# Patient Record
Sex: Female | Born: 1995
Health system: Southern US, Community
[De-identification: ages and names within clinical notes are randomized; demographics above are authoritative.]

## PROBLEM LIST (undated history)

## (undated) DIAGNOSIS — F329 Major depressive disorder, single episode, unspecified: Secondary | ICD-10-CM

## (undated) DIAGNOSIS — J189 Pneumonia, unspecified organism: Secondary | ICD-10-CM

## (undated) DIAGNOSIS — E739 Lactose intolerance, unspecified: Secondary | ICD-10-CM

## (undated) DIAGNOSIS — N920 Excessive and frequent menstruation with regular cycle: Secondary | ICD-10-CM

## (undated) DIAGNOSIS — N390 Urinary tract infection, site not specified: Principal | ICD-10-CM

## (undated) DIAGNOSIS — F32A Depression, unspecified: Secondary | ICD-10-CM

## (undated) DIAGNOSIS — K589 Irritable bowel syndrome without diarrhea: Secondary | ICD-10-CM

## (undated) DIAGNOSIS — E039 Hypothyroidism, unspecified: Secondary | ICD-10-CM

## (undated) DIAGNOSIS — B019 Varicella without complication: Secondary | ICD-10-CM

## (undated) DIAGNOSIS — R51 Headache: Secondary | ICD-10-CM

## (undated) DIAGNOSIS — T7840XA Allergy, unspecified, initial encounter: Secondary | ICD-10-CM

## (undated) DIAGNOSIS — J349 Unspecified disorder of nose and nasal sinuses: Secondary | ICD-10-CM

## (undated) DIAGNOSIS — H5210 Myopia, unspecified eye: Secondary | ICD-10-CM

## (undated) HISTORY — DX: Excessive and frequent menstruation with regular cycle: N92.0

## (undated) HISTORY — DX: Myopia, unspecified eye: H52.10

## (undated) HISTORY — DX: Varicella without complication: B01.9

## (undated) HISTORY — DX: Hypothyroidism, unspecified: E03.9

## (undated) HISTORY — DX: Lactose intolerance, unspecified: E73.9

## (undated) HISTORY — PX: TYMPANOSTOMY TUBE PLACEMENT: SHX32

## (undated) HISTORY — DX: Allergy, unspecified, initial encounter: T78.40XA

## (undated) HISTORY — PX: ADENOIDECTOMY: SUR15

## (undated) HISTORY — DX: Urinary tract infection, site not specified: N39.0

## (undated) HISTORY — DX: Pneumonia, unspecified organism: J18.9

---
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
## 2015-02-03 ENCOUNTER — Encounter: Payer: Self-pay | Admitting: Pediatrics

## 2015-02-03 ENCOUNTER — Ambulatory Visit (INDEPENDENT_AMBULATORY_CARE_PROVIDER_SITE_OTHER): Payer: BC Managed Care – PPO | Admitting: Pediatrics

## 2015-02-03 VITALS — Wt 116.7 lb

## 2015-02-03 DIAGNOSIS — L03031 Cellulitis of right toe: Secondary | ICD-10-CM | POA: Diagnosis not present

## 2015-02-03 MED ORDER — CEPHALEXIN 500 MG PO CAPS: 500.0000 mg | ORAL_CAPSULE | Freq: Three times a day (TID) | ORAL | Status: AC

## 2015-02-03 NOTE — Patient Instructions (Addendum)
Keflex, 1 capsul three times a day for 10 days  [facility] if no improvement after 5 days of antibiotic  Paronychia Paronychia is an inflammatory reaction involving the folds of the skin surrounding the fingernail. This is commonly caused by an infection in the skin around a nail. The most common cause of paronychia is frequent wetting of the hands (as seen with bartenders, food servers, nurses or others who wet their hands). This makes the skin around the fingernail susceptible to infection by bacteria (germs) or fungus. Other predisposing factors are:  Aggressive manicuring.  Nail biting.  Thumb sucking. The most common cause is a staphylococcal (a type of germ) infection, or a fungal (Candida) infection. When caused by a germ, it usually comes on suddenly with redness, swelling, pus and is often painful. It may get under the nail and form an abscess (collection of pus), or form an abscess around the nail. If the nail itself is infected with a fungus, the treatment is usually prolonged and may require oral medicine for up to one year. Your caregiver will determine the length of time treatment is required. The paronychia caused by bacteria (germs) may largely be avoided by not pulling on hangnails or picking at cuticles. When the infection occurs at the tips of the finger it is called felon. When the cause of paronychia is from the herpes simplex virus (HSV) it is called herpetic whitlow. TREATMENT  When an abscess is present treatment is often incision and drainage. This means that the abscess must be cut open so the pus can get out. When this is done, the following home care instructions should be followed. HOME CARE INSTRUCTIONS   It is important to keep the affected fingers very dry. Rubber or plastic gloves over cotton gloves should be used whenever the hand must be placed in water.  Keep wound clean, dry and dressed as suggested by your caregiver between warm soaks  or warm compresses.  Soak in warm water for fifteen to twenty minutes three to four times per day for bacterial infections. Fungal infections are very difficult to treat, so often require treatment for long periods of time.  For bacterial (germ) infections take antibiotics (medicine which kill germs) as directed and finish the prescription, even if the problem appears to be solved before the medicine is gone.  Only take over-the-counter or prescription medicines for pain, discomfort, or fever as directed by your caregiver. SEEK IMMEDIATE MEDICAL CARE IF:  You have redness, swelling, or increasing pain in the wound.  You notice pus coming from the wound.  You have a fever.  You notice a bad smell coming from the wound or dressing. Document Released: 04/19/2001 Document Revised: 01/16/2012 Document Reviewed: 12/19/2008 [facility] Patient Information 2015 [employer] [place]. This information is not intended to replace advice given to you by your health care provider. Make sure you discuss any questions you have with your health care provider.

## 2015-02-03 NOTE — Progress Notes (Signed)
Colleen Brown is a 19yo female who presents for evaluation of a possible infection located at right great toe. Symptoms include erythema located proximal to toenail and pain with palpation. Patient denies chills and fever greater than 100. Precipitating event:unknown. Treatment to date has included warm water soaks with no relief.  The following portions of the patient's history were reviewed and updated as appropriate: allergies, current medications, past family history, past medical history, past social history, past surgical history and problem list.  Review of Systems  Pertinent items are noted in HPI.  Objective:   General appearance: alert and cooperative  Extremities: normal except for right great toe with mild erythema and swelling  Skin: Skin color, texture, turgor normal. No rashes or lesions  Neurologic: Grossly normal  Assessment:   Paronychia, right great toe  Plan:   Keflex prescribed.  Pain medication: OTC.  Warm water and epsom salt soaks  Follow up as needed

## 2015-04-07 ENCOUNTER — Encounter: Payer: Self-pay | Admitting: Pediatrics

## 2015-04-07 ENCOUNTER — Ambulatory Visit (INDEPENDENT_AMBULATORY_CARE_PROVIDER_SITE_OTHER): Payer: BC Managed Care – PPO | Admitting: Pediatrics

## 2015-04-07 VITALS — Wt 114.8 lb

## 2015-04-07 DIAGNOSIS — N390 Urinary tract infection, site not specified: Secondary | ICD-10-CM

## 2015-04-07 DIAGNOSIS — R0781 Pleurodynia: Secondary | ICD-10-CM

## 2015-04-07 HISTORY — DX: Urinary tract infection, site not specified: N39.0

## 2015-04-07 LAB — POCT URINALYSIS DIPSTICK
BILIRUBIN UA: NEGATIVE
Glucose, UA: NEGATIVE
KETONES UA: NEGATIVE
Nitrite, UA: POSITIVE
Spec Grav, UA: 1.02
Urobilinogen, UA: NEGATIVE
pH, UA: 6

## 2015-04-07 LAB — POCT URINE PREGNANCY: Preg Test, Ur: NEGATIVE

## 2015-04-07 MED ORDER — CEPHALEXIN 500 MG PO CAPS: 500.0000 mg | ORAL_CAPSULE | Freq: Three times a day (TID) | ORAL | Status: AC

## 2015-04-07 NOTE — Progress Notes (Signed)
PCP:Georgiann Hahn, MD Chief Complaint  Patient presents with  . rib cage pain    Current Issues:  Presents with 7 days of pain under the left side rib cage Associated symptoms include:  flank pain on the left  There is no history of of similar symptoms. Sexually active:  Yes with female.   No concern for STI.  Prior to Admission medications   Medication Sig Start Date End Date Taking? Authorizing Provider  albuterol (PROAIR HFA) 108 (90 BASE) MCG/ACT inhaler Inhale 2 puffs into the lungs every 4 (four) hours as needed (for cough or chest tightness). Also, 15 min before intense phys activity. 01/06/14   Meryl Dare, NP  calcium carbonate (OS-CAL) 600 MG TABS Take 600 mg by mouth 2 (two) times daily with a meal.    Historical Provider, MD  cephALEXin (KEFLEX) 500 MG capsule Take 1 capsule (500 mg total) by mouth 3 (three) times daily. 04/07/15 04/15/15  Estelle June, NP  cetirizine (ZYRTEC) 10 MG tablet Take 10 mg by mouth daily.    Historical Provider, MD  GILDESS FE 1/20 1-20 MG-MCG tablet  07/15/13   Historical Provider, MD  glycopyrrolate (ROBINUL) 1 MG tablet Take 1 tablet (1 mg total) by mouth 2 (two) times daily. 01/22/14   Meryl Dare, MD  hyoscyamine (LEVSIN, ANASPAZ) 0.125 MG tablet take 1 tablet by mouth every 4 hours if needed for STOMACH CRAMPS AND DIARRHEA 02/03/14   Georgiann Hahn, MD  ibuprofen (ADVIL,MOTRIN) 600 MG tablet Take 1 tablet (600 mg total) by mouth every 6 (six) hours as needed. 06/10/14   April Palumbo, MD  levothyroxine (SYNTHROID) 75 MCG tablet Take 1 tablet (75 mcg total) by mouth daily before breakfast. 10/28/14 11/11/14  Preston Fleeting, MD  levothyroxine (SYNTHROID, LEVOTHROID) 75 MCG tablet take 1 tablet by mouth once daily 04/02/14 05/03/14  Georgiann Hahn, MD  methocarbamol (ROBAXIN) 500 MG tablet Take 1 tablet (500 mg total) by mouth 2 (two) times daily. 06/10/14   April Palumbo, MD  Multiple Vitamin (MULTIVITAMIN) tablet Take 1 tablet by mouth daily.     Historical Provider, MD  sertraline (ZOLOFT) 50 MG tablet Take 50 mg by mouth daily.    Historical Provider, MD  traMADol (ULTRAM) 50 MG tablet Take 1 tablet (50 mg total) by mouth every 6 (six) hours as needed. 06/10/14   April Palumbo, MD  traZODone (DESYREL) 100 MG tablet Take 100 mg by mouth at bedtime.    Historical Provider, MD    Review of Systems:pertinent data listed in HPI  PE:  Wt 114 lb 12.8 oz (52.073 kg) Constitutional- well nourished, well developed young adult, no distress Heart-regular rate and rhythm, no murmurs, clicks or rubs Lungs-bilateral clear to auscultation Back- left sided CVA Abdomen- upper left quadrant tender to palpation, soft, non-distended   Results for orders placed or performed in visit on 04/07/15  POCT Urinalysis Dipstick  Result Value Ref Range   Color, UA yellow    Clarity, UA cloudy    Glucose, UA neg    Bilirubin, UA neg    Ketones, UA neg    Spec Grav, UA 1.020    Blood, UA trace    pH, UA 6.0    Protein, UA trace    Urobilinogen, UA negative    Nitrite, UA pos    Leukocytes, UA Trace   POCT urine pregnancy  Result Value Ref Range   Preg Test, Ur Negative     Assessment  UTI Rib  pain on left side    and Plan:  - POCT Urinalysis Dipstick - POCT urine pregnancy - Urine culture, pending -started on keflex Follow up as needed

## 2015-04-07 NOTE — Patient Instructions (Addendum)
Drink plenty of water Keflex, three times a day for 7 days If no improvement in 4 days, call the office  Urinary Tract Infection Urinary tract infections (UTIs) can develop anywhere along your urinary tract. Your urinary tract is your body's drainage system for removing wastes and extra water. Your urinary tract includes two kidneys, two ureters, a bladder, and a urethra. Your kidneys are a pair of bean-shaped organs. Each kidney is about the size of your fist. They are located below your ribs, one on each side of your spine. CAUSES Infections are caused by microbes, which are microscopic organisms, including fungi, viruses, and bacteria. These organisms are so small that they can only be seen through a microscope. Bacteria are the microbes that most commonly cause UTIs. SYMPTOMS  Symptoms of UTIs may vary by age and gender of the patient and by the location of the infection. Symptoms in young women typically include a frequent and intense urge to urinate and a painful, burning feeling in the bladder or urethra during urination. Older women and men are more likely to be tired, shaky, and weak and have muscle aches and abdominal pain. A fever may mean the infection is in your kidneys. Other symptoms of a kidney infection include pain in your back or sides below the ribs, nausea, and vomiting. DIAGNOSIS To diagnose a UTI, your caregiver will ask you about your symptoms. Your caregiver also will ask to provide a urine sample. The urine sample will be tested for bacteria and white blood cells. White blood cells are made by your body to help fight infection. TREATMENT  Typically, UTIs can be treated with medication. Because most UTIs are caused by a bacterial infection, they usually can be treated with the use of antibiotics. The choice of antibiotic and length of treatment depend on your symptoms and the type of bacteria causing your infection. HOME CARE INSTRUCTIONS  If you were prescribed antibiotics,  take them exactly as your caregiver instructs you. Finish the medication even if you feel better after you have only taken some of the medication.  Drink enough water and fluids to keep your urine clear or pale yellow.  Avoid caffeine, tea, and carbonated beverages. They tend to irritate your bladder.  Empty your bladder often. Avoid holding urine for long periods of time.  Empty your bladder before and after sexual intercourse.  After a bowel movement, women should cleanse from front to back. Use each tissue only once. SEEK MEDICAL CARE IF:   You have back pain.  You develop a fever.  Your symptoms do not begin to resolve within 3 days. SEEK IMMEDIATE MEDICAL CARE IF:   You have severe back pain or lower abdominal pain.  You develop chills.  You have nausea or vomiting.  You have continued burning or discomfort with urination. MAKE SURE YOU:   Understand these instructions.  Will watch your condition.  Will get help right away if you are not doing well or get worse. Document Released: 08/03/2005 Document Revised: 04/24/2012 Document Reviewed: 12/02/2011 [facility] Patient Information 2015 [place], [place]. This information is not intended to replace advice given to you by your health care provider. Make sure you discuss any questions you have with your health care provider.

## 2015-04-09 LAB — URINE CULTURE
Colony Count: NO GROWTH
Organism ID, Bacteria: NO GROWTH

## 2015-04-13 ENCOUNTER — Encounter: Payer: Self-pay | Admitting: Pediatrics

## 2015-04-13 ENCOUNTER — Ambulatory Visit (INDEPENDENT_AMBULATORY_CARE_PROVIDER_SITE_OTHER): Payer: BC Managed Care – PPO | Admitting: Pediatrics

## 2015-04-13 ENCOUNTER — Ambulatory Visit
Admission: RE | Admit: 2015-04-13 | Discharge: 2015-04-13 | Disposition: A | Discharge status: Home / Self Care | Payer: BC Managed Care – PPO | Source: Ambulatory Visit | Attending: Pediatrics | Admitting: Pediatrics

## 2015-04-13 VITALS — Wt 115.9 lb

## 2015-04-13 DIAGNOSIS — R1012 Left upper quadrant pain: Secondary | ICD-10-CM

## 2015-04-13 DIAGNOSIS — R0781 Pleurodynia: Secondary | ICD-10-CM

## 2015-04-13 LAB — CBC WITH DIFFERENTIAL/PLATELET
BASOS ABS: 0.1 10*3/uL (ref 0.0–0.1)
Basophils Relative: 1 % (ref 0–1)
EOS ABS: 0.5 10*3/uL (ref 0.0–0.7)
EOS PCT: 6 % — AB (ref 0–5)
HCT: 40.7 % (ref 36.0–46.0)
Hemoglobin: 14.1 g/dL (ref 12.0–15.0)
Lymphocytes Relative: 30 % (ref 12–46)
Lymphs Abs: 2.5 10*3/uL (ref 0.7–4.0)
MCH: 30.5 pg (ref 26.0–34.0)
MCHC: 34.6 g/dL (ref 30.0–36.0)
MCV: 87.9 fL (ref 78.0–100.0)
MPV: 9.7 fL (ref 8.6–12.4)
Monocytes Absolute: 0.6 10*3/uL (ref 0.1–1.0)
Monocytes Relative: 7 % (ref 3–12)
NEUTROS PCT: 56 % (ref 43–77)
Neutro Abs: 4.6 10*3/uL (ref 1.7–7.7)
Platelets: 284 10*3/uL (ref 150–400)
RBC: 4.63 MIL/uL (ref 3.87–5.11)
RDW: 12.4 % (ref 11.5–15.5)
WBC: 8.2 10*3/uL (ref 4.0–10.5)

## 2015-04-13 LAB — COMPREHENSIVE METABOLIC PANEL
ALK PHOS: 45 U/L (ref 39–117)
ALT: 13 U/L (ref 0–35)
AST: 16 U/L (ref 0–37)
Albumin: 4.4 g/dL (ref 3.5–5.2)
BILIRUBIN TOTAL: 0.7 mg/dL (ref 0.2–1.1)
BUN: 8 mg/dL (ref 6–23)
CO2: 28 mEq/L (ref 19–32)
Calcium: 10 mg/dL (ref 8.4–10.5)
Chloride: 97 mEq/L (ref 96–112)
Creat: 0.81 mg/dL (ref 0.50–1.10)
GLUCOSE: 88 mg/dL (ref 70–99)
Potassium: 4.2 mEq/L (ref 3.5–5.3)
SODIUM: 134 meq/L — AB (ref 135–145)
TOTAL PROTEIN: 7.5 g/dL (ref 6.0–8.3)

## 2015-04-13 LAB — THYROID PANEL WITH TSH
Free Thyroxine Index: 2.4 (ref 1.4–3.8)
T3 UPTAKE: 24 % (ref 22–35)
T4, Total: 9.9 ug/dL (ref 4.5–12.0)
TSH: 4.976 u[IU]/mL — ABNORMAL HIGH (ref 0.350–4.500)

## 2015-04-13 MED ORDER — LEVOTHYROXINE SODIUM 75 MCG PO TABS: 75.0000 ug | ORAL_TABLET | Freq: Every day | ORAL | Status: DC

## 2015-04-13 NOTE — Patient Instructions (Signed)
Abdominal xray- [facility]- [address] Lab work- will call with results

## 2015-04-13 NOTE — Progress Notes (Signed)
Maralyn SagoSarah is a 19yo female who was seen on 04/07/2015 for left lower rib pain. She was diagnosed with a UTI and started on abx. She returns today with continued left rib pain. Denies fever, vomiting, diarrhea.    Review of Systems  Constitutional:  Negative for  appetite change.  HENT:  Negative for nasal and ear discharge.   Eyes: Negative for discharge, redness and itching.  Respiratory:  Negative for cough and wheezing.   Cardiovascular: Negative.  Gastrointestinal: Negative for vomiting and diarrhea.  Musculoskeletal: Negative for arthralgias. Positive for pain in the left rib area.  Skin: Negative for rash.  Neurological: Negative      Objective:   Physical Exam  Constitutional: Appears well-developed and well-nourished.   Pulmonary/Chest: Effort normal and breath sounds normal. No wheezes with  no retractions.  Abdominal: Soft. Bowel sounds are normal. No distension and no tenderness.  Neurological: Active and alert.  Skin: Skin is warm and moist. No rash noted.      Assessment:      Left upper abdominal pain  Plan:   Labs: CBC with diff, CMP, Thyroid panel  Abdominal xray to rule out constipation and other pathologic processes  Follow as needed

## 2015-04-14 ENCOUNTER — Telehealth: Payer: Self-pay | Admitting: Pediatrics

## 2015-04-14 MED ORDER — LEVOTHYROXINE SODIUM 88 MCG PO TABS: 88.0000 ug | ORAL_TABLET | Freq: Every day | ORAL | Status: DC

## 2015-04-14 NOTE — Telephone Encounter (Signed)
Colleen Brown called, wanted to find out if there was a drug interaction between the hyoscyamine and Miralax. Assured her there was not a drug interaction. Explained how Miralax works.

## 2015-04-14 NOTE — Telephone Encounter (Signed)
Correction to previous note. Will increase (generic) synthroid to daily and recheck levels in 6-8 weeks

## 2015-04-14 NOTE — Telephone Encounter (Signed)
Spoke with Colleen Brown regarding lab and xray results. Levothyroxine increased from 75mcg daily to 88mcg daily. Will recheck TSH and free T4 end of July, early August before she returns to college. Miralax, 1 capful a day, to resolve constipation. Colleen Brown verbalized understanding and agreement with therapy plan.

## 2015-04-14 NOTE — Telephone Encounter (Signed)
Abdominal xray showed moderate stool burden in colon. Likely causing left upper abdominal pain. Will encourage Miralax for clean out.  TSH level is elevated, will increase levothyroxine to 13300mcg/day Left message requesting call back

## 2015-06-01 ENCOUNTER — Encounter: Payer: Self-pay | Admitting: Family

## 2015-06-01 ENCOUNTER — Ambulatory Visit (INDEPENDENT_AMBULATORY_CARE_PROVIDER_SITE_OTHER): Payer: BC Managed Care – PPO | Admitting: Family

## 2015-06-01 VITALS — Wt 116.8 lb

## 2015-06-01 DIAGNOSIS — Z139 Encounter for screening, unspecified: Secondary | ICD-10-CM

## 2015-06-01 DIAGNOSIS — B9689 Other specified bacterial agents as the cause of diseases classified elsewhere: Secondary | ICD-10-CM

## 2015-06-01 DIAGNOSIS — A499 Bacterial infection, unspecified: Secondary | ICD-10-CM | POA: Diagnosis not present

## 2015-06-01 DIAGNOSIS — J329 Chronic sinusitis, unspecified: Secondary | ICD-10-CM

## 2015-06-01 LAB — THYROID PANEL WITH TSH
Free Thyroxine Index: 2.5 (ref 1.4–3.8)
T3 UPTAKE: 25 % (ref 22–35)
T4 TOTAL: 10.1 ug/dL (ref 4.5–12.0)
TSH: 2.419 u[IU]/mL (ref 0.350–4.500)

## 2015-06-01 MED ORDER — AZITHROMYCIN 250 MG PO TABS: ORAL_TABLET | ORAL | Status: DC

## 2015-06-01 NOTE — Progress Notes (Signed)
Subjective:     Colleen Brown is a 19 y.o. female who presents for evaluation of sinus pain. Symptoms include: congestion, facial pain, fevers, nasal congestion, post nasal drip, puffiness of the eyes, sinus pressure and sore throat. Onset of symptoms was 7 days ago. Symptoms have been gradually worsening since that time. Past history is significant for no history of pneumonia or bronchitis. Patient is a non-smoker.  The following portions of the patient's history were reviewed and updated as appropriate: allergies, current medications, past family history, past medical history, past social history, past surgical history and problem list.  Review of Systems Constitutional: negative Ears, nose, mouth, throat, and face: positive for nasal congestion, sore throat and sinus pain Respiratory: negative Cardiovascular: negative   Objective:    General appearance: alert, cooperative and no distress Ears: normal TM's and external ear canals both ears Nose: mild congestion, sinus tenderness bilateral Throat: lips, mucosa, and tongue normal; teeth and gums normal Lungs: clear to auscultation bilaterally Heart: regular rate and rhythm, S1, S2 normal, no murmur, click, rub or gallop    Assessment:    Acute bacterial sinusitis.    Plan:    Nasal saline sprays. Azithromycin per medication orders. Follow up as needed

## 2015-06-01 NOTE — Patient Instructions (Signed)

## 2015-06-12 ENCOUNTER — Encounter: Payer: Self-pay | Admitting: Podiatry

## 2015-06-12 ENCOUNTER — Ambulatory Visit (INDEPENDENT_AMBULATORY_CARE_PROVIDER_SITE_OTHER): Payer: BC Managed Care – PPO | Admitting: Podiatry

## 2015-06-12 ENCOUNTER — Ambulatory Visit: Payer: Self-pay

## 2015-06-12 VITALS — BP 106/65 | HR 82 | Resp 12

## 2015-06-12 DIAGNOSIS — L03031 Cellulitis of right toe: Secondary | ICD-10-CM

## 2015-06-12 DIAGNOSIS — L600 Ingrowing nail: Secondary | ICD-10-CM

## 2015-06-12 DIAGNOSIS — M79673 Pain in unspecified foot: Secondary | ICD-10-CM

## 2015-06-12 DIAGNOSIS — L03011 Cellulitis of right finger: Secondary | ICD-10-CM

## 2015-06-12 MED ORDER — AZITHROMYCIN 250 MG PO TABS: ORAL_TABLET | ORAL | Status: DC

## 2015-06-12 NOTE — Progress Notes (Signed)
   Subjective:    Patient ID: Colleen Brown, female    DOB: 06-Nov-1996, 19 y.o.   MRN: [id number]  HPI  Patient presents here today with right great toenail which is painful and sore, since 01/2015, went pcp and was prescribed antibiotic medicine which did not help.  Review of Systems  All other systems reviewed and are negative.      Objective:   Physical Exam        Assessment & Plan:

## 2015-06-12 NOTE — Patient Instructions (Signed)

## 2015-06-13 NOTE — Progress Notes (Signed)
Subjective:     Patient ID: Colleen Brown, female   DOB: 1996-03-24, 19 y.o.   MRN: [id number]  HPI patient presents with damage right hallux nail that she has had antibiotics for previously but it got better for a little while and then came back. Does not remember specific injury   Review of Systems  All other systems reviewed and are negative.      Objective:   Physical Exam  Constitutional: She is oriented to person, place, and time.  Cardiovascular: Intact distal pulses.   Musculoskeletal: Normal range of motion.  Neurological: She is oriented to person, place, and time.  Skin: Skin is warm.  Nursing note and vitals reviewed.  neurovascular status was found to be intact with muscle strength adequate and range of motion within normal limits. Patient's right hallux mostly medial side is red with mild inflammation and discomfort when pressed with distal drainage noted and moderate damage to the rest of the nailbed which is difficult to say whether it's primary or secondary in nature. Good digital perfusion and well oriented 3     Assessment:     Localized paronychia infection of the right hallux hopefully just medial border with possible damage to the entire nail bed and plate itself    Plan:     H&P and condition reviewed with patient. Today were to try to just work on the medial side and I infiltrated the right hallux 60 mg I can Marcaine mixture applied sterile prep to the toe and then removed the medial border removed all proud flesh abscess tissue and allowed channel for drainage. Placed on Z-Pak and reappoint to recheck

## 2015-07-16 ENCOUNTER — Encounter: Payer: Self-pay | Admitting: Pediatrics

## 2015-07-16 ENCOUNTER — Ambulatory Visit (INDEPENDENT_AMBULATORY_CARE_PROVIDER_SITE_OTHER): Payer: BC Managed Care – PPO | Admitting: Pediatrics

## 2015-07-16 VITALS — Temp 97.2°F | Wt 117.0 lb

## 2015-07-16 DIAGNOSIS — J019 Acute sinusitis, unspecified: Secondary | ICD-10-CM

## 2015-07-16 MED ORDER — AZITHROMYCIN 250 MG PO TABS: ORAL_TABLET | ORAL | Status: AC

## 2015-07-16 NOTE — Patient Instructions (Signed)
Continue using Flonase and nasal saline spray Warm salt water gargles to help with throat Continue taking mucinex- drink plenty of water Z-pak  Sinusitis Sinusitis is redness, soreness, and inflammation of the paranasal sinuses. Paranasal sinuses are air pockets within the bones of your face (beneath the eyes, the middle of the forehead, or above the eyes). In healthy paranasal sinuses, mucus is able to drain out, and air is able to circulate through them by way of your nose. However, when your paranasal sinuses are inflamed, mucus and air can become trapped. This can allow bacteria and other germs to grow and cause infection. Sinusitis can develop quickly and last only a short time (acute) or continue over a long period (chronic). Sinusitis that lasts for more than 12 weeks is considered chronic.  CAUSES  Causes of sinusitis include:  Allergies.  Structural abnormalities, such as displacement of the cartilage that separates your nostrils (deviated septum), which can decrease the air flow through your nose and sinuses and affect sinus drainage.  Functional abnormalities, such as when the small hairs (cilia) that line your sinuses and help remove mucus do not work properly or are not present. SIGNS AND SYMPTOMS  Symptoms of acute and chronic sinusitis are the same. The primary symptoms are pain and pressure around the affected sinuses. Other symptoms include:  Upper toothache.  Earache.  Headache.  Bad breath.  Decreased sense of smell and taste.  A cough, which worsens when you are lying flat.  Fatigue.  Fever.  Thick drainage from your nose, which often is green and may contain pus (purulent).  Swelling and warmth over the affected sinuses. DIAGNOSIS  Your health care provider will perform a physical exam. During the exam, your health care provider may:  Look in your nose for signs of abnormal growths in your nostrils (nasal polyps).  Tap over the affected sinus to check  for signs of infection.  View the inside of your sinuses (endoscopy) using an imaging device that has a light attached (endoscope). If your health care provider suspects that you have chronic sinusitis, one or more of the following tests may be recommended:  Allergy tests.  Nasal culture. A sample of mucus is taken from your nose, sent to a lab, and screened for bacteria.  Nasal cytology. A sample of mucus is taken from your nose and examined by your health care provider to determine if your sinusitis is related to an allergy. TREATMENT  Most cases of acute sinusitis are related to a viral infection and will resolve on their own within 10 days. Sometimes medicines are prescribed to help relieve symptoms (pain medicine, decongestants, nasal steroid sprays, or saline sprays).  However, for sinusitis related to a bacterial infection, your health care provider will prescribe antibiotic medicines. These are medicines that will help kill the bacteria causing the infection.  Rarely, sinusitis is caused by a fungal infection. In theses cases, your health care provider will prescribe antifungal medicine. For some cases of chronic sinusitis, surgery is needed. Generally, these are cases in which sinusitis recurs more than 3 times per year, despite other treatments. HOME CARE INSTRUCTIONS   Drink plenty of water. Water helps thin the mucus so your sinuses can drain more easily.  Use a humidifier.  Inhale steam 3 to 4 times a day (for example, sit in the bathroom with the shower running).  Apply a warm, moist washcloth to your face 3 to 4 times a day, or as directed by your health care provider.  Use saline nasal sprays to help moisten and clean your sinuses.  Take medicines only as directed by your health care provider.  If you were prescribed either an antibiotic or antifungal medicine, finish it all even if you start to feel better. SEEK IMMEDIATE MEDICAL CARE IF:  You have increasing pain or  severe headaches.  You have nausea, vomiting, or drowsiness.  You have swelling around your face.  You have vision problems.  You have a stiff neck.  You have difficulty breathing. MAKE SURE YOU:   Understand these instructions.  Will watch your condition.  Will get help right away if you are not doing well or get worse. Document Released: 10/24/2005 Document Revised: 03/10/2014 Document Reviewed: 11/08/2011 [facility] Patient Information 2015 [place], [place]. This information is not intended to replace advice given to you by your health care provider. Make sure you discuss any questions you have with your health care provider.

## 2015-07-16 NOTE — Progress Notes (Signed)
Subjective:     Colleen Brown is a 19 y.o. female who presents for evaluation of sinus pain. Symptoms include: congestion, facial pain, fevers, frequent clearing of the throat, headaches, nasal congestion and sinus pressure. Onset of symptoms was 6 days ago. Symptoms have been rapidly worsening since that time. Past history is significant for no history of pneumonia or bronchitis. Patient is a non-smoker.  The following portions of the patient's history were reviewed and updated as appropriate: allergies, current medications, past family history, past medical history, past social history, past surgical history and problem list.  Review of Systems Pertinent items are noted in HPI.   Objective:    Temp(Src) 97.2 F (36.2 C)  Wt 117 lb (53.071 kg) General appearance: alert, cooperative, appears stated age and no distress Head: Normocephalic, without obvious abnormality, atraumatic Eyes: conjunctivae/corneas clear. PERRL, EOM's intact. Fundi benign. Ears: normal TM's and external ear canals both ears Nose: moderate congestion, turbinates red, swollen, sinus tenderness bilateral Throat: lips, mucosa, and tongue normal; teeth and gums normal Neck: no adenopathy, no carotid bruit, no JVD, supple, symmetrical, trachea midline and thyroid not enlarged, symmetric, no tenderness/mass/nodules Lungs: clear to auscultation bilaterally Heart: regular rate and rhythm, S1, S2 normal, no murmur, click, rub or gallop    Assessment:    Acute sinusitis.    Plan:    Nasal saline sprays. Neti pot recommended. Instructions given. Azithromycin per medication orders. Follow up as needed

## 2015-10-22 IMAGING — CR DG ABDOMEN 1V
1 series · 1 of 1 positions shown · non-contrast
Comparison: None

CLINICAL DATA: Left upper quadrant pain for the past week without
bowel changes or nausea or vomiting ; no history of kidney stones

EXAM:
ABDOMEN - 1 VIEW

[t abdomen supine]
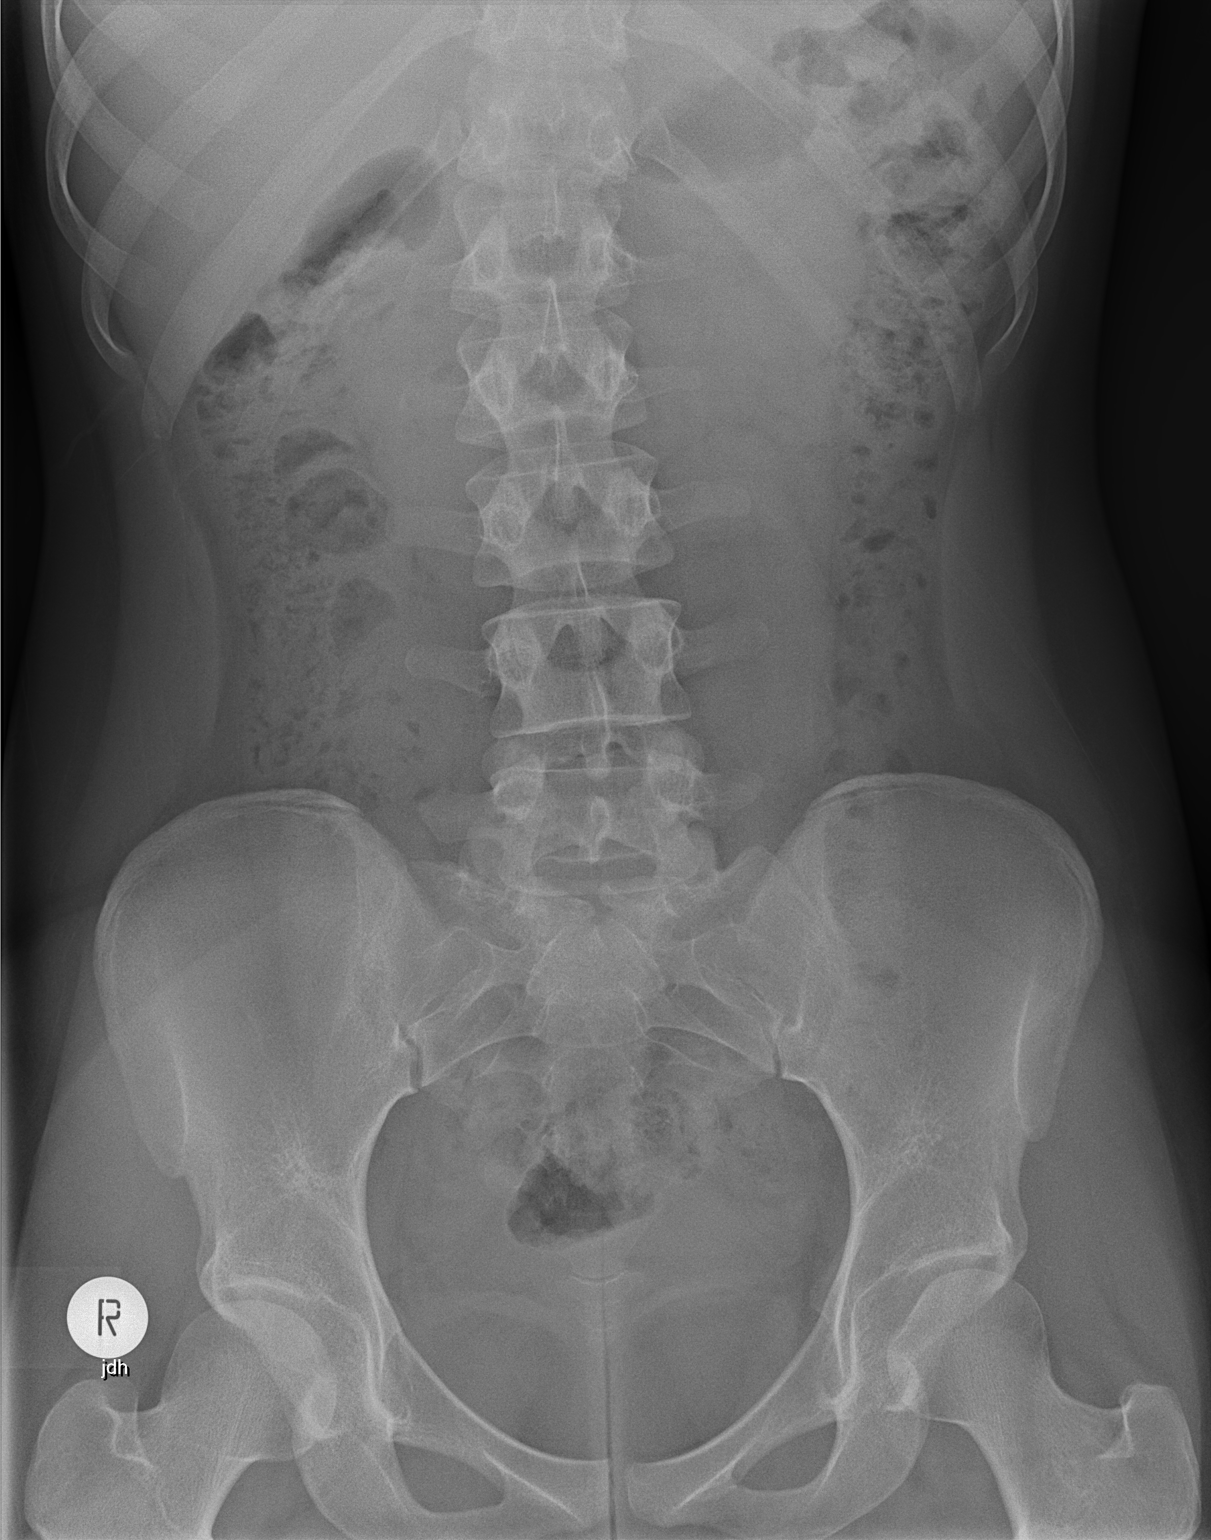

[1 of 1 positions shown; findings below may reference images not displayed]

FINDINGS: There is moderately increased stool burden within the colon. There
is no small or large bowel obstruction. There are no abnormal soft
tissue calcifications. There is gentle dextrocurvature centered at
the thoracolumbar junction. There is spina bifida occulta at S1.
IMPRESSION: Increased colonic stool burden may reflect clinical constipation.
There is no other acute intra-abdominal abnormality.

## 2015-11-23 ENCOUNTER — Ambulatory Visit: Payer: BC Managed Care – PPO | Admitting: Family

## 2015-11-23 ENCOUNTER — Other Ambulatory Visit: Payer: Self-pay | Admitting: Obstetrics and Gynecology

## 2016-04-02 ENCOUNTER — Encounter (HOSPITAL_COMMUNITY): Payer: Self-pay | Admitting: *Deleted

## 2016-04-02 ENCOUNTER — Ambulatory Visit (HOSPITAL_COMMUNITY)
Admission: EM | Admit: 2016-04-02 | Discharge: 2016-04-02 | Disposition: A | Discharge status: Home / Self Care | Payer: BC Managed Care – PPO | Attending: Emergency Medicine | Admitting: Emergency Medicine

## 2016-04-02 DIAGNOSIS — J329 Chronic sinusitis, unspecified: Secondary | ICD-10-CM | POA: Diagnosis not present

## 2016-04-02 DIAGNOSIS — B349 Viral infection, unspecified: Secondary | ICD-10-CM | POA: Diagnosis not present

## 2016-04-02 DIAGNOSIS — B9789 Other viral agents as the cause of diseases classified elsewhere: Secondary | ICD-10-CM

## 2016-04-02 MED ORDER — AZITHROMYCIN 250 MG PO TABS: ORAL_TABLET | ORAL | Status: DC

## 2016-04-02 MED ORDER — PREDNISONE 50 MG PO TABS: ORAL_TABLET | ORAL | Status: DC

## 2016-04-02 NOTE — Discharge Instructions (Signed)
You have a sinus infection. This is likely caused by a virus. Take prednisone daily for the next 5 days. Things are not improving in 2 days, fill the prescription for azithromycin. It is okay to continue Sudafed and Nettie pot as needed. Follow-up as needed.

## 2016-04-02 NOTE — ED Notes (Signed)
Patient reports runny nose, sore throat, and fever since yesterday. Took tylenol pta. Has tried netty pot, claritin, sudafed, fluticasone, and advil at home with no relief.

## 2016-04-02 NOTE — ED Provider Notes (Signed)
CSN: [id number]     Arrival date & time 04/02/16  1819 History   First MD Initiated Contact with Patient 04/02/16 1905     Chief Complaint  Patient presents with  . Nasal Congestion  . Fever  . Sore Throat   (Consider location/radiation/quality/duration/timing/severity/associated sxs/prior Treatment) HPI  She is a 20 year old woman here for evaluation of sinus issues. Her symptoms started yesterday with nasal congestion, rhinorrhea, sore throat, postnasal drainage, fevers, and body aches. Last fever was this afternoon; she reports it as 99.1.  She has been taking multiple over-the-counter medications and using an Nettie pot without improvement. She reports green mucus from her nose. She has had some mild nausea, but no vomiting. She is eating and drinking well. She reports a history of frequent sinus infections related to deviated septum.  Past Medical History  Diagnosis Date  . Hypothyroidism     Hashimotos  . Allergy     tree and grass  . Headache(784.0)     sinus headaches  . Depression     counseling and meds  . Asthma     sports-induced; no med. in 2 yrs.  . Sinus disease 05/2012    related to deviated septum which has been correcte3d  . Pneumonia   . Varicella 2002  . Lactose intolerance 10/15/2013  . Myopia 10/15/2013    Corrective lenses  . Menorrhagia 10/15/2013    Hormonal theraphy  . IBS (irritable bowel syndrome)   . UTI (lower urinary tract infection) 04/07/2015   Past Surgical History  Procedure Laterality Date  . Adenoidectomy  age 66 - 3  . Tympanostomy tube placement  age 66 - 3  . Orif nasal fracture  06/01/2012    Procedure: OPEN REDUCTION INTERNAL FIXATION (ORIF) NASAL FRACTURE;  Surgeon: Drema Halonhristopher E Newman, MD;  Location: [facility];  Service: ENT;  Laterality: N/A;  . Nasal sinus surgery  06/01/2012    Procedure: ENDOSCOPIC SINUS SURGERY;  Surgeon: Drema Halonhristopher E Newman, MD;  Location: [facility];  Service: ENT;  Laterality:  Bilateral;  FESS  . Ethmoidectomy  06/01/2012    Procedure: ETHMOIDECTOMY;  Surgeon: Drema Halonhristopher E Newman, MD;  Location: [facility];  Service: ENT;  Laterality: Bilateral;  . Wisdom tooth extraction  2014   Family History  Problem Relation Age of Onset  . Hypothyroidism Mother   . Hypertension Father   . Hypertension Brother   . Kidney disease Maternal Grandmother     secondary to scarlet fever  . Hypertension Paternal Grandmother   . Heart disease Paternal Grandfather   . Lupus Paternal Aunt    Social History  Substance Use Topics  . Smoking status: Never Smoker   . Smokeless tobacco: Never Used  . Alcohol Use: No   OB History    No data available     Review of Systems As in history of present illness Allergies  Penicillins; Amoxicillin; Cefprozil; and Montelukast  Home Medications   Prior to Admission medications   Medication Sig Start Date End Date Taking? Authorizing Provider  albuterol (PROAIR HFA) 108 (90 BASE) MCG/ACT inhaler Inhale 2 puffs into the lungs every 4 (four) hours as needed (for cough or chest tightness). Also, 15 min before intense phys activity. 01/06/14  Yes Meryl DareErin W Whitaker, NP  glycopyrrolate (ROBINUL) 1 MG tablet Take 1 tablet (1 mg total) by mouth 2 (two) times daily. 01/22/14  Yes Meryl DareMalcolm T Stark, MD  ibuprofen (ADVIL,MOTRIN) 600 MG tablet Take 1 tablet (600  mg total) by mouth every 6 (six) hours as needed. 06/10/14  Yes April Palumbo, MD  levothyroxine (SYNTHROID, LEVOTHROID) 88 MCG tablet Take 1 tablet (88 mcg total) by mouth daily before breakfast. 04/14/15 04/13/16 Yes Estelle June, NP  loratadine (CLARITIN) 10 MG tablet Take 10 mg by mouth daily.   Yes Historical Provider, MD  Multiple Vitamin (MULTIVITAMIN) tablet Take 1 tablet by mouth daily.   Yes Historical Provider, MD  sertraline (ZOLOFT) 100 MG tablet Take 100 mg by mouth daily. 05/28/15  Yes Historical Provider, MD  traZODone (DESYREL) 100 MG tablet Take 100 mg by mouth at bedtime.    Yes Historical Provider, MD  azithromycin (ZITHROMAX Z-PAK) 250 MG tablet Take 2 pills today, then 1 pill daily until gone. 04/02/16   Charm Rings, MD  predniSONE (DELTASONE) 50 MG tablet Take 1 pill daily for 5 days. 04/02/16   Charm Rings, MD   Meds Ordered and Administered this Visit  Medications - No data to display  BP 104/87 mmHg  Pulse 106  Temp(Src) 98.1 F (36.7 C) (Oral)  Resp 19  SpO2 100%  LMP 03/07/2016 No data found.   Physical Exam  Constitutional: She is oriented to person, place, and time. She appears well-developed and well-nourished. No distress.  HENT:  Mouth/Throat: No oropharyngeal exudate.  Moderate cobblestoning with postnasal drainage. Nasal discharge present. Nasal mucosa is normal. TMs normal bilaterally.  Neck: Neck supple.  Cardiovascular: Normal rate, regular rhythm and normal heart sounds.   No murmur heard. Pulmonary/Chest: Effort normal and breath sounds normal. No respiratory distress. She has no wheezes. She has no rales.  Lymphadenopathy:    She has no cervical adenopathy.  Neurological: She is alert and oriented to person, place, and time.    ED Course  Procedures (including critical care time)  Labs Review Labs Reviewed - No data to display  Imaging Review No results found.   MDM   1. Viral sinusitis    Discussed with patient that this is likely viral. She states her sinus infections are typically treated with a Z-Pak. I encouraged her to give the prednisone 2 days to see if that will resolve the issues. If not, she can fill the prescription for azithromycin. Follow-up as needed.    Charm Rings, MD 04/02/16 [phone]

## 2016-12-02 ENCOUNTER — Other Ambulatory Visit: Payer: Self-pay | Admitting: Obstetrics and Gynecology

## 2016-12-05 LAB — CYTOLOGY - PAP

## 2019-03-10 ENCOUNTER — Other Ambulatory Visit: Payer: Self-pay

## 2019-03-10 ENCOUNTER — Encounter (HOSPITAL_COMMUNITY): Payer: Self-pay | Admitting: Emergency Medicine

## 2019-03-10 ENCOUNTER — Emergency Department (HOSPITAL_COMMUNITY)
Admission: EM | Admit: 2019-03-10 | Discharge: 2019-03-10 | Disposition: A | Discharge status: Home / Self Care | Payer: No Typology Code available for payment source | Attending: Emergency Medicine | Admitting: Emergency Medicine

## 2019-03-10 DIAGNOSIS — Y929 Unspecified place or not applicable: Secondary | ICD-10-CM | POA: Insufficient documentation

## 2019-03-10 DIAGNOSIS — E039 Hypothyroidism, unspecified: Secondary | ICD-10-CM | POA: Diagnosis not present

## 2019-03-10 DIAGNOSIS — W2209XA Striking against other stationary object, initial encounter: Secondary | ICD-10-CM | POA: Diagnosis not present

## 2019-03-10 DIAGNOSIS — Y990 Civilian activity done for income or pay: Secondary | ICD-10-CM | POA: Diagnosis not present

## 2019-03-10 DIAGNOSIS — J45909 Unspecified asthma, uncomplicated: Secondary | ICD-10-CM | POA: Diagnosis not present

## 2019-03-10 DIAGNOSIS — S0990XA Unspecified injury of head, initial encounter: Secondary | ICD-10-CM | POA: Diagnosis present

## 2019-03-10 DIAGNOSIS — Y9389 Activity, other specified: Secondary | ICD-10-CM | POA: Diagnosis not present

## 2019-03-10 DIAGNOSIS — Z79899 Other long term (current) drug therapy: Secondary | ICD-10-CM | POA: Insufficient documentation

## 2019-03-10 NOTE — ED Provider Notes (Signed)
[facility] EMERGENCY DEPARTMENT Provider Note   CSN: [id number] Arrival date & time: 03/10/19  1841    History   Chief Complaint Chief Complaint  Patient presents with  . Head Injury    HPI Colleen Brown is a 23 y.o. female.     HPI   Colleen Brown is a 23 y.o. female, with a history of depression, IBS, presenting to the ED with head injury.  Last night around 7 PM, patient was at work, bent down to pick something up, hit the top of her head on plastic shelving.  Complains of headache, throbbing, global, nonradiating.  Also complains of some nausea and lightheadedness. Denies anticoagulation. Denies LOC, seizures, confusion, vomiting, neck/back pain, falls, vision changes, neuro deficits, or any other complaints.   Past Medical History:  Diagnosis Date  . Allergy    tree and grass  . Asthma    sports-induced; no med. in 2 yrs.  . Depression    counseling and meds  . Headache(784.0)    sinus headaches  . Hypothyroidism    Hashimotos  . IBS (irritable bowel syndrome)   . Lactose intolerance 10/15/2013  . Menorrhagia 10/15/2013   Hormonal theraphy  . Myopia 10/15/2013   Corrective lenses  . Pneumonia   . Sinus disease 05/2012   related to deviated septum which has been correcte3d  . UTI (lower urinary tract infection) 04/07/2015  . Varicella 2002    Patient Active Problem List   Diagnosis Date Noted  . Abdominal pain, left upper quadrant 04/13/2015  . UTI (lower urinary tract infection) 04/07/2015  . Exercise-induced asthma 10/15/2013  . Lactose intolerance 10/15/2013  . Allergic rhinitis 10/15/2013  . Myopia 10/15/2013  . Menorrhagia 10/15/2013  . Depression 10/14/2013  . Hypothyroid 04/23/2011    Past Surgical History:  Procedure Laterality Date  . ADENOIDECTOMY  age 34 - 3  . ETHMOIDECTOMY  06/01/2012   Procedure: ETHMOIDECTOMY;  Surgeon: Drema Halon, MD;  Location: [facility];  Service: ENT;  Laterality:  Bilateral;  . NASAL SINUS SURGERY  06/01/2012   Procedure: ENDOSCOPIC SINUS SURGERY;  Surgeon: Drema Halon, MD;  Location: [facility];  Service: ENT;  Laterality: Bilateral;  FESS  . ORIF NASAL FRACTURE  06/01/2012   Procedure: OPEN REDUCTION INTERNAL FIXATION (ORIF) NASAL FRACTURE;  Surgeon: Drema Halon, MD;  Location: [facility];  Service: ENT;  Laterality: N/A;  . TYMPANOSTOMY TUBE PLACEMENT  age 34 - 3  . WISDOM TOOTH EXTRACTION  2014     OB History   No obstetric history on file.      Home Medications    Prior to Admission medications   Medication Sig Start Date End Date Taking? Authorizing Provider  albuterol (PROAIR HFA) 108 (90 BASE) MCG/ACT inhaler Inhale 2 puffs into the lungs every 4 (four) hours as needed (for cough or chest tightness). Also, 15 min before intense phys activity. 01/06/14  Yes Whitaker, Ethelda Chick, NP  fexofenadine (ALLEGRA) 180 MG tablet Take 180 mg by mouth daily.   Yes [provider]  ibuprofen (ADVIL) 200 MG tablet Take 200 mg by mouth every 6 (six) hours as needed for moderate pain.   Yes [provider]  levothyroxine (SYNTHROID) 112 MCG tablet Take 112 mcg by mouth daily before breakfast.   Yes [provider]  Multiple Vitamin (MULTIVITAMIN) tablet Take 1 tablet by mouth daily.   Yes [provider]  PREVIFEM 0.25-35 MG-MCG  tablet Take 1 tablet by mouth daily. 02/24/19  Yes [provider]  sertraline (ZOLOFT) 100 MG tablet Take 150 mg by mouth daily.  05/28/15  Yes [provider]  traZODone (DESYREL) 100 MG tablet Take 100 mg by mouth at bedtime.   Yes [provider]    Family History Family History  Problem Relation Age of Onset  . Hypothyroidism Mother   . Hypertension Father   . Hypertension Brother   . Kidney disease Maternal Grandmother        secondary to scarlet fever  . Hypertension Paternal Grandmother   . Heart disease Paternal  Grandfather   . Lupus Paternal Aunt     Social History Social History   Tobacco Use  . Smoking status: Never Smoker  . Smokeless tobacco: Never Used  Substance Use Topics  . Alcohol use: Yes  . Drug use: No     Allergies   Penicillins; Amoxicillin; Cefprozil; and Montelukast   Review of Systems Review of Systems  Constitutional: Negative for diaphoresis.  Eyes: Negative for visual disturbance.  Respiratory: Negative for shortness of breath.   Cardiovascular: Negative for chest pain.  Gastrointestinal: Positive for nausea. Negative for vomiting.  Musculoskeletal: Negative for back pain and neck pain.  Skin: Positive for wound.  Neurological: Positive for light-headedness and headaches. Negative for seizures, syncope, weakness and numbness.  Psychiatric/Behavioral: Negative for confusion.  All other systems reviewed and are negative.    Physical Exam Updated Vital Signs BP 135/76 (BP Location: Left Arm)   Pulse 87   Temp 98.7 F (37.1 C) (Oral)   Resp 17   Ht 5' (1.524 m)   Wt 56.7 kg   LMP 02/12/2019   SpO2 98%   BMI 24.41 kg/m   Physical Exam Vitals signs and nursing note reviewed.  Constitutional:      General: She is not in acute distress.    Appearance: She is well-developed. She is not diaphoretic.  HENT:     Head: Normocephalic.     Comments: Quite superficial, less than 0.25 cm laceration/abrasion to left parietal scalp.  No gaping.  No hemorrhage.  No swelling, deformity, or instability.    Nose: Nose normal.     Mouth/Throat:     Mouth: Mucous membranes are moist.     Pharynx: Oropharynx is clear.  Eyes:     Extraocular Movements: Extraocular movements intact.     Conjunctiva/sclera: Conjunctivae normal.     Pupils: Pupils are equal, round, and reactive to light.  Neck:     Musculoskeletal: Normal range of motion and neck supple. No neck rigidity.  Cardiovascular:     Rate and Rhythm: Normal rate and regular rhythm.     Pulses: Normal  pulses.     Comments: Tactile temperature in the extremities appropriate and equal bilaterally. Pulmonary:     Effort: Pulmonary effort is normal. No respiratory distress.  Abdominal:     Tenderness: There is no guarding.  Musculoskeletal:     Right lower leg: No edema.     Left lower leg: No edema.  Skin:    General: Skin is warm and dry.  Neurological:     Mental Status: She is alert and oriented to person, place, and time.     Comments: Sensation grossly intact to light touch in the extremities.  Grip strengths equal bilaterally.  Strength 5/5 in all extremities. No gait disturbance. Coordination intact. Cranial nerves III-XII grossly intact. No facial droop.   Psychiatric:  Mood and Affect: Mood and affect normal.        Speech: Speech normal.        Behavior: Behavior normal.      ED Treatments / Results  Labs (all labs ordered are listed, but only abnormal results are displayed) Labs Reviewed - No data to display  EKG None  Radiology No results found.  Procedures Procedures (including critical care time)  Medications Ordered in ED Medications - No data to display   Initial Impression / Assessment and Plan / ED Course  I have reviewed the triage vital signs and the nursing notes.  Pertinent labs & imaging results that were available during my care of the patient were reviewed by me and considered in my medical decision making (see chart for details).        Patient presents with a head injury that occurred yesterday evening.  She complains of headache, nausea, and lightheadedness.  No evidence of neurologic dysfunction.  No indication for CT imaging.  Discussed Zofran for nausea, patient declined.  Patient to follow-up on her symptoms at outpatient clinic.  Resources given. The patient was given instructions for home care as well as return precautions. Patient voices understanding of these instructions, accepts the plan, and is comfortable with discharge.    Final Clinical Impressions(s) / ED Diagnoses   Final diagnoses:  Injury of head, initial encounter    ED Discharge Orders    None       Concepcion LivingJoy, Triana Coover C, PA-C 03/11/19 1947    Pricilla LovelessGoldston, Scott, MD 03/14/19 1248

## 2019-03-10 NOTE — ED Triage Notes (Signed)
PT was bending over twice at work and hit top of head on a plastic box.  States it happened around 6 and 6:30.  No LOC.  Employer sent her to ED.

## 2019-03-10 NOTE — Discharge Instructions (Signed)
°  Head Injury °You have been seen today for a head injury. It does not appear to be serious at this time.  °Close observation: The close observation period is usually 6 hours from the injury. This includes staying awake and having a trustworthy adult monitor you to assure your condition does not worsen. You should be in regular contact with this person and ideally, they should be able to monitor you in person.  °Secondary observation: The secondary observation period is usually 24 hours from the injury. You are allowed to sleep during this time. A trustworthy adult should intermittently monitor you to assure your condition does not worsen.  ° °Overall head injury/concussion care: °Rest: Be sure to get plenty of rest. You will need more rest and sleep while you recover. °Hydration: Be sure to stay well hydrated by having a goal of drinking about 0.5 liters of water an hour. °Pain:  °Antiinflammatory medications: Take 600 mg of ibuprofen every 6 hours or 440 mg (over the counter dose) to 500 mg (prescription dose) of naproxen every 12 hours or for the next 3 days. After this time, these medications may be used as needed for pain. Take these medications with food to avoid upset stomach. Choose only one of these medications, do not take them together. °Tylenol: Should you continue to have additional pain while taking the ibuprofen or naproxen, you may add in tylenol as needed. Your daily total maximum amount of tylenol from all sources should be limited to 4000mg/day for persons without liver problems, or 2000mg/day for those with liver problems. °Return to sports and activities: In general, you may return to normal activities once symptoms have subsided, however, you would ideally be cleared by a primary care provider or other qualified medical professional prior to return to these activities. ° °Follow up: Follow up with the concussion clinic or your primary care provider for further management of this issue. °Return:  Return to the ED should you begin to have confusion, abnormal behavior, aggression, violence, or personality changes, repeated vomiting, vision loss, numbness or weakness on one side of the body, difficulty standing due to dizziness, significantly worsening pain, or any other major concerns. °

## 2019-03-10 NOTE — ED Notes (Signed)
RN notes a small abrasion on top of the patient's head. Slight swelling noted around area that is tender to touch.

## 2019-03-11 ENCOUNTER — Telehealth: Payer: Self-pay

## 2019-03-11 NOTE — Telephone Encounter (Signed)
Patient called this morning asking to cancel her concussion clinic visit. She does not wish to reschedule at this time.

## 2019-03-11 NOTE — Telephone Encounter (Signed)
Spoke with patient. She was working on Saturday when she hit her head on a box twice. No LOC. No history of head injury. Was seen in ED. Has been having headaches, dizziness and nausea. On schedule tomorrow. Recommended to rest today and to not engage in physical activity until seen in the morning. Patient voices understanding.

## 2019-03-12 ENCOUNTER — Ambulatory Visit: Payer: Self-pay | Admitting: Family Medicine

## 2021-01-18 DIAGNOSIS — Z03818 Encounter for observation for suspected exposure to other biological agents ruled out: Secondary | ICD-10-CM | POA: Diagnosis not present

## 2021-01-18 DIAGNOSIS — J069 Acute upper respiratory infection, unspecified: Secondary | ICD-10-CM | POA: Diagnosis not present

## 2021-01-18 DIAGNOSIS — J029 Acute pharyngitis, unspecified: Secondary | ICD-10-CM | POA: Diagnosis not present

## 2021-01-18 DIAGNOSIS — Z20822 Contact with and (suspected) exposure to covid-19: Secondary | ICD-10-CM | POA: Diagnosis not present

## 2021-01-19 DIAGNOSIS — R0981 Nasal congestion: Secondary | ICD-10-CM | POA: Diagnosis not present

## 2021-01-19 DIAGNOSIS — J019 Acute sinusitis, unspecified: Secondary | ICD-10-CM | POA: Diagnosis not present

## 2021-02-24 DIAGNOSIS — Z20822 Contact with and (suspected) exposure to covid-19: Secondary | ICD-10-CM | POA: Diagnosis not present

## 2021-03-31 DIAGNOSIS — Z20822 Contact with and (suspected) exposure to covid-19: Secondary | ICD-10-CM | POA: Diagnosis not present

## 2021-04-14 DIAGNOSIS — F3341 Major depressive disorder, recurrent, in partial remission: Secondary | ICD-10-CM | POA: Diagnosis not present

## 2021-04-14 DIAGNOSIS — F411 Generalized anxiety disorder: Secondary | ICD-10-CM | POA: Diagnosis not present

## 2021-04-22 DIAGNOSIS — Z0000 Encounter for general adult medical examination without abnormal findings: Secondary | ICD-10-CM | POA: Diagnosis not present

## 2021-04-23 DIAGNOSIS — Z0000 Encounter for general adult medical examination without abnormal findings: Secondary | ICD-10-CM | POA: Diagnosis not present

## 2021-05-21 DIAGNOSIS — Z111 Encounter for screening for respiratory tuberculosis: Secondary | ICD-10-CM | POA: Diagnosis not present

## 2021-05-25 DIAGNOSIS — Z20822 Contact with and (suspected) exposure to covid-19: Secondary | ICD-10-CM | POA: Diagnosis not present

## 2021-05-26 DIAGNOSIS — U071 COVID-19: Secondary | ICD-10-CM | POA: Diagnosis not present

## 2022-12-06 DIAGNOSIS — U071 COVID-19: Secondary | ICD-10-CM | POA: Diagnosis not present

## 2022-12-06 DIAGNOSIS — R509 Fever, unspecified: Secondary | ICD-10-CM | POA: Diagnosis not present

## 2022-12-07 DIAGNOSIS — U071 COVID-19: Secondary | ICD-10-CM | POA: Diagnosis not present

## 2022-12-07 DIAGNOSIS — R509 Fever, unspecified: Secondary | ICD-10-CM | POA: Diagnosis not present

## 2022-12-26 DIAGNOSIS — E039 Hypothyroidism, unspecified: Secondary | ICD-10-CM | POA: Diagnosis not present

## 2023-01-12 DIAGNOSIS — H6692 Otitis media, unspecified, left ear: Secondary | ICD-10-CM | POA: Diagnosis not present

## 2023-01-12 DIAGNOSIS — Z6825 Body mass index (BMI) 25.0-25.9, adult: Secondary | ICD-10-CM | POA: Diagnosis not present

## 2023-01-23 DIAGNOSIS — Z124 Encounter for screening for malignant neoplasm of cervix: Secondary | ICD-10-CM | POA: Diagnosis not present

## 2023-01-23 DIAGNOSIS — Z113 Encounter for screening for infections with a predominantly sexual mode of transmission: Secondary | ICD-10-CM | POA: Diagnosis not present

## 2023-03-04 DIAGNOSIS — J069 Acute upper respiratory infection, unspecified: Secondary | ICD-10-CM | POA: Diagnosis not present

## 2023-03-07 DIAGNOSIS — J45901 Unspecified asthma with (acute) exacerbation: Secondary | ICD-10-CM | POA: Diagnosis not present

## 2023-03-07 DIAGNOSIS — J019 Acute sinusitis, unspecified: Secondary | ICD-10-CM | POA: Diagnosis not present

## 2023-03-07 DIAGNOSIS — Z6824 Body mass index (BMI) 24.0-24.9, adult: Secondary | ICD-10-CM | POA: Diagnosis not present

## 2023-03-21 DIAGNOSIS — L299 Pruritus, unspecified: Secondary | ICD-10-CM | POA: Diagnosis not present

## 2023-06-16 DIAGNOSIS — Z6825 Body mass index (BMI) 25.0-25.9, adult: Secondary | ICD-10-CM | POA: Diagnosis not present

## 2023-06-16 DIAGNOSIS — E039 Hypothyroidism, unspecified: Secondary | ICD-10-CM | POA: Diagnosis not present

## 2023-06-16 DIAGNOSIS — Z0000 Encounter for general adult medical examination without abnormal findings: Secondary | ICD-10-CM | POA: Diagnosis not present

## 2023-06-16 DIAGNOSIS — F418 Other specified anxiety disorders: Secondary | ICD-10-CM | POA: Diagnosis not present

## 2023-06-16 DIAGNOSIS — Z111 Encounter for screening for respiratory tuberculosis: Secondary | ICD-10-CM | POA: Diagnosis not present

## 2023-07-02 DIAGNOSIS — J029 Acute pharyngitis, unspecified: Secondary | ICD-10-CM | POA: Diagnosis not present

## 2023-07-05 DIAGNOSIS — Z6824 Body mass index (BMI) 24.0-24.9, adult: Secondary | ICD-10-CM | POA: Diagnosis not present

## 2023-07-05 DIAGNOSIS — J029 Acute pharyngitis, unspecified: Secondary | ICD-10-CM | POA: Diagnosis not present

## 2023-07-05 DIAGNOSIS — R051 Acute cough: Secondary | ICD-10-CM | POA: Diagnosis not present

## 2023-07-05 DIAGNOSIS — Z03818 Encounter for observation for suspected exposure to other biological agents ruled out: Secondary | ICD-10-CM | POA: Diagnosis not present

## 2023-08-28 DIAGNOSIS — R300 Dysuria: Secondary | ICD-10-CM | POA: Diagnosis not present

## 2023-08-28 DIAGNOSIS — R509 Fever, unspecified: Secondary | ICD-10-CM | POA: Diagnosis not present

## 2023-08-28 DIAGNOSIS — Z03818 Encounter for observation for suspected exposure to other biological agents ruled out: Secondary | ICD-10-CM | POA: Diagnosis not present

## 2023-09-01 DIAGNOSIS — J029 Acute pharyngitis, unspecified: Secondary | ICD-10-CM | POA: Diagnosis not present

## 2024-01-01 DIAGNOSIS — Z833 Family history of diabetes mellitus: Secondary | ICD-10-CM | POA: Diagnosis not present

## 2024-01-01 DIAGNOSIS — E039 Hypothyroidism, unspecified: Secondary | ICD-10-CM | POA: Diagnosis not present

## 2024-01-01 DIAGNOSIS — F419 Anxiety disorder, unspecified: Secondary | ICD-10-CM | POA: Diagnosis not present

## 2024-01-01 DIAGNOSIS — Z888 Allergy status to other drugs, medicaments and biological substances status: Secondary | ICD-10-CM | POA: Diagnosis not present

## 2024-01-01 DIAGNOSIS — Z8249 Family history of ischemic heart disease and other diseases of the circulatory system: Secondary | ICD-10-CM | POA: Diagnosis not present

## 2024-01-01 DIAGNOSIS — F325 Major depressive disorder, single episode, in full remission: Secondary | ICD-10-CM | POA: Diagnosis not present

## 2024-01-01 DIAGNOSIS — Z880 Allergy status to penicillin: Secondary | ICD-10-CM | POA: Diagnosis not present

## 2024-01-01 DIAGNOSIS — Z79890 Hormone replacement therapy (postmenopausal): Secondary | ICD-10-CM | POA: Diagnosis not present

## 2024-01-29 DIAGNOSIS — Z01419 Encounter for gynecological examination (general) (routine) without abnormal findings: Secondary | ICD-10-CM | POA: Diagnosis not present

## 2024-01-29 DIAGNOSIS — Z113 Encounter for screening for infections with a predominantly sexual mode of transmission: Secondary | ICD-10-CM | POA: Diagnosis not present

## 2024-05-23 DIAGNOSIS — Z79899 Other long term (current) drug therapy: Secondary | ICD-10-CM | POA: Diagnosis not present

## 2024-05-23 DIAGNOSIS — Z79891 Long term (current) use of opiate analgesic: Secondary | ICD-10-CM | POA: Diagnosis not present

## 2024-05-23 DIAGNOSIS — F330 Major depressive disorder, recurrent, mild: Secondary | ICD-10-CM | POA: Diagnosis not present

## 2024-06-02 DIAGNOSIS — L0102 Bockhart's impetigo: Secondary | ICD-10-CM | POA: Diagnosis not present
